# Patient Record
Sex: Female | Born: 1975 | Race: White | Hispanic: No | Marital: Married | Smoking: Former smoker
Health system: Southern US, Community
[De-identification: ages and names within clinical notes are randomized; demographics above are authoritative.]

## PROBLEM LIST (undated history)

## (undated) DIAGNOSIS — F419 Anxiety disorder, unspecified: Secondary | ICD-10-CM

## (undated) DIAGNOSIS — M542 Cervicalgia: Secondary | ICD-10-CM

## (undated) DIAGNOSIS — M25519 Pain in unspecified shoulder: Secondary | ICD-10-CM

## (undated) DIAGNOSIS — M549 Dorsalgia, unspecified: Secondary | ICD-10-CM

## (undated) DIAGNOSIS — I10 Essential (primary) hypertension: Secondary | ICD-10-CM

## (undated) DIAGNOSIS — J45909 Unspecified asthma, uncomplicated: Secondary | ICD-10-CM

## (undated) DIAGNOSIS — R000 Tachycardia, unspecified: Secondary | ICD-10-CM

## (undated) DIAGNOSIS — N289 Disorder of kidney and ureter, unspecified: Secondary | ICD-10-CM

## (undated) DIAGNOSIS — K219 Gastro-esophageal reflux disease without esophagitis: Secondary | ICD-10-CM

## (undated) HISTORY — DX: Dorsalgia, unspecified: M54.9

## (undated) HISTORY — DX: Anxiety disorder, unspecified: F41.9

## (undated) HISTORY — DX: Pain in unspecified shoulder: M25.519

## (undated) HISTORY — DX: Cervicalgia: M54.2

## (undated) HISTORY — DX: Gastro-esophageal reflux disease without esophagitis: K21.9

## (undated) HISTORY — DX: Unspecified asthma, uncomplicated: J45.909

## (undated) HISTORY — DX: Tachycardia, unspecified: R00.0

## (undated) HISTORY — DX: Essential (primary) hypertension: I10

---

## 2003-02-21 ENCOUNTER — Ambulatory Visit (HOSPITAL_COMMUNITY): Admission: RE | Admit: 2003-02-21 | Discharge: 2003-02-21 | Payer: Self-pay | Admitting: Obstetrics and Gynecology

## 2003-03-12 ENCOUNTER — Encounter: Admission: RE | Admit: 2003-03-12 | Discharge: 2003-03-12 | Payer: Self-pay | Admitting: Obstetrics and Gynecology

## 2003-03-19 ENCOUNTER — Encounter: Admission: RE | Admit: 2003-03-19 | Discharge: 2003-03-19 | Payer: Self-pay | Admitting: Obstetrics and Gynecology

## 2003-03-19 ENCOUNTER — Ambulatory Visit (HOSPITAL_COMMUNITY): Admission: RE | Admit: 2003-03-19 | Discharge: 2003-03-19 | Payer: Self-pay | Admitting: Obstetrics and Gynecology

## 2003-03-25 ENCOUNTER — Inpatient Hospital Stay (HOSPITAL_COMMUNITY): Admission: AD | Admit: 2003-03-25 | Discharge: 2003-03-25 | Payer: Self-pay | Admitting: Obstetrics and Gynecology

## 2003-03-27 ENCOUNTER — Inpatient Hospital Stay (HOSPITAL_COMMUNITY): Admission: AD | Admit: 2003-03-27 | Discharge: 2003-03-29 | Payer: Self-pay | Admitting: Obstetrics and Gynecology

## 2004-08-10 IMAGING — US US ABDOMEN COMPLETE
1 series · 18 of 25 positions shown · non-contrast
Comparison: none

[Series 1: us abdomen complete · 18 of 54 slices shown]
[im 1/54]
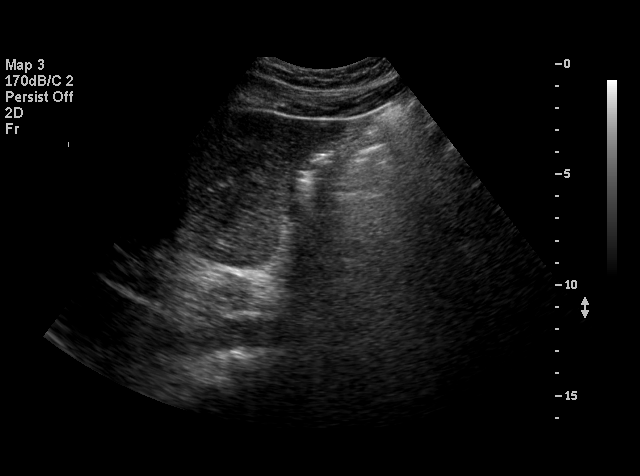
[im 5/54]
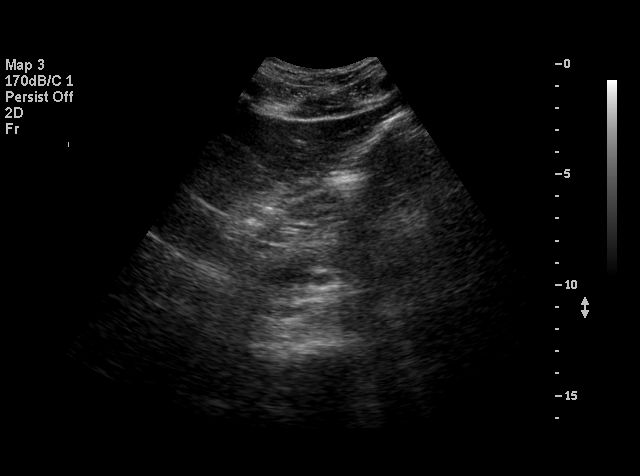
[im 7/54]
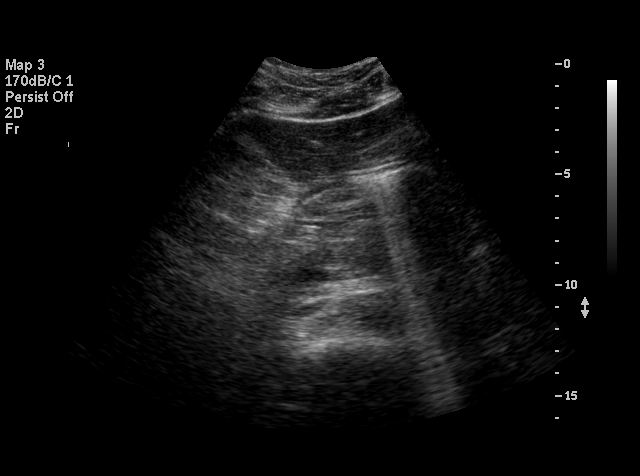
[im 9/54]
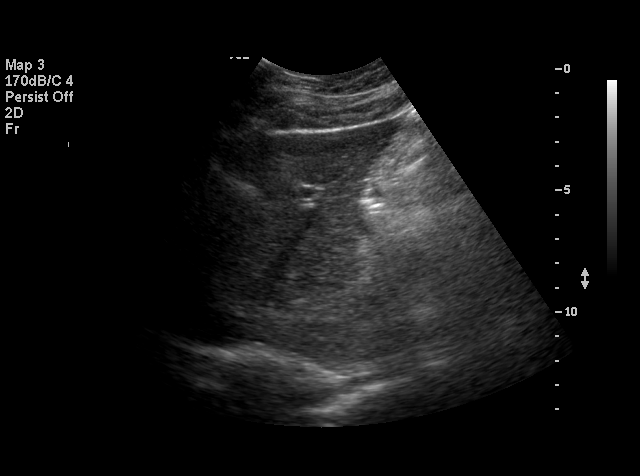
[im 14/54]
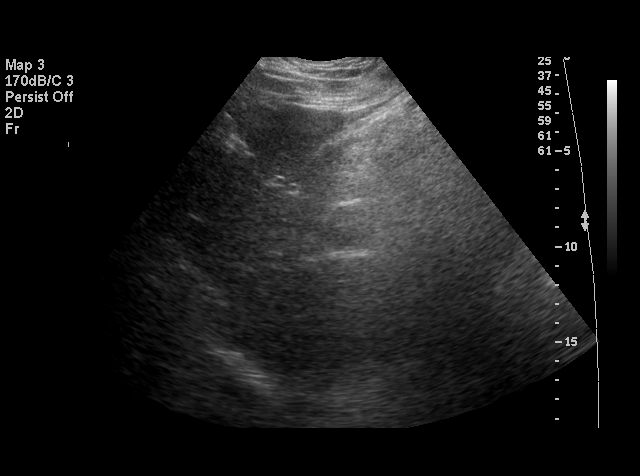
[im 16/54]
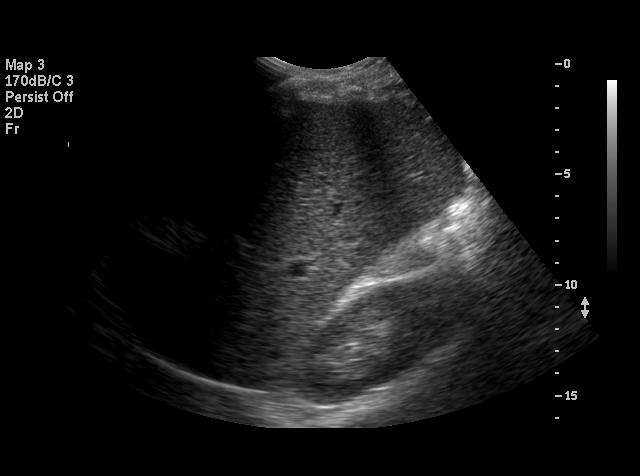
[im 20/54]
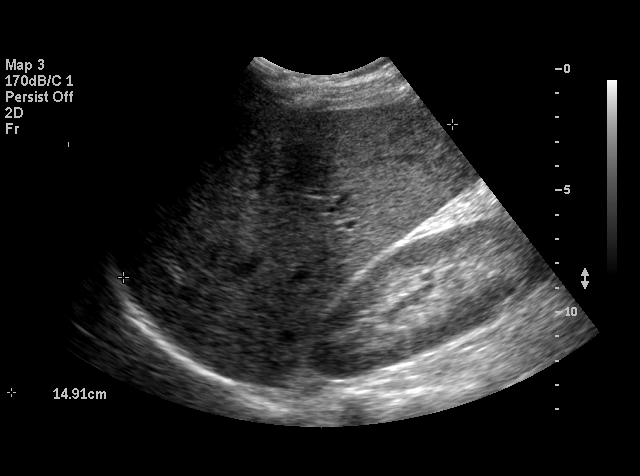
[im 23/54]
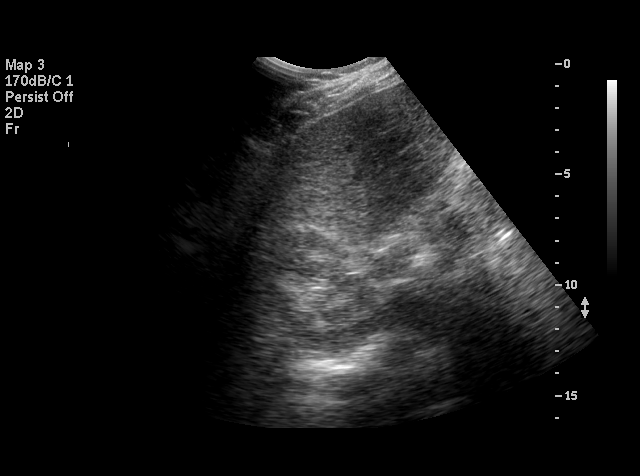
[im 25/54]
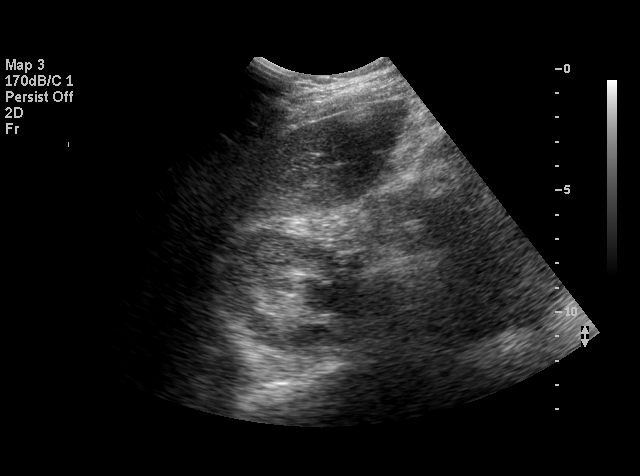
[im 29/54]
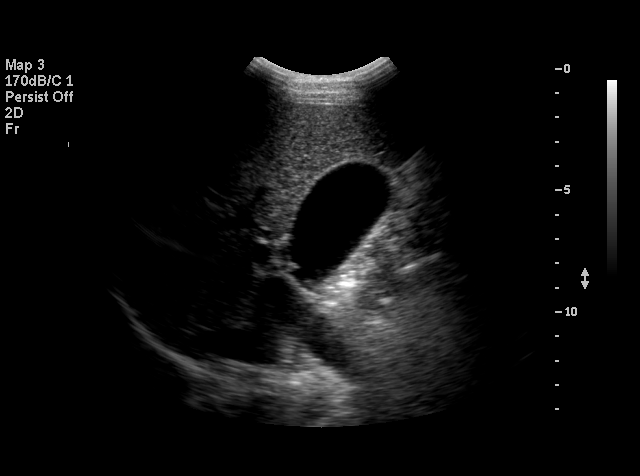
[im 31/54]
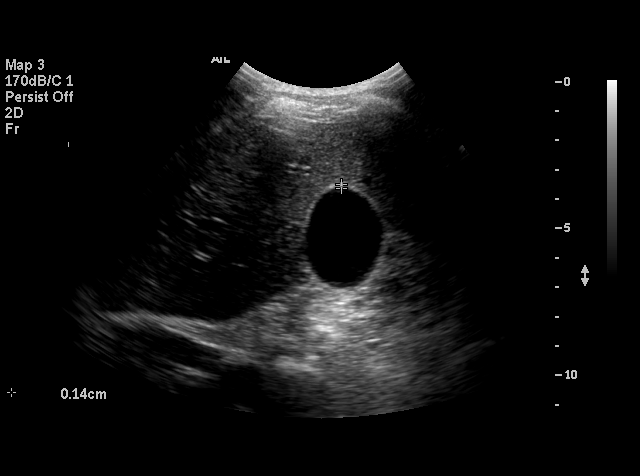
[im 34/54]
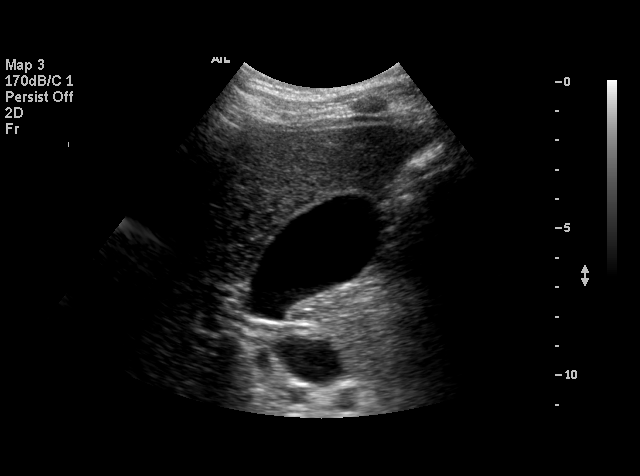
[im 38/54]
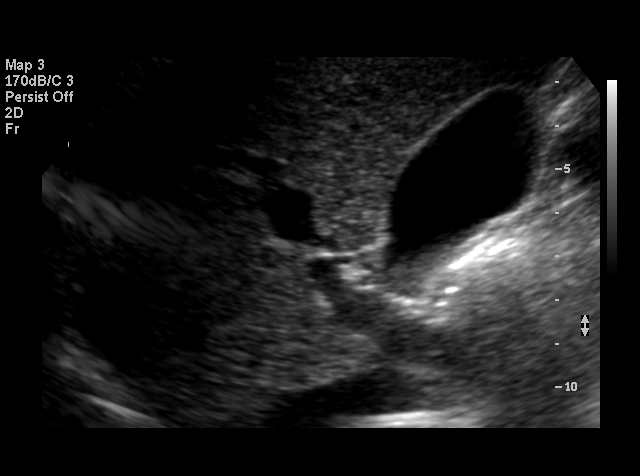
[im 40/54]
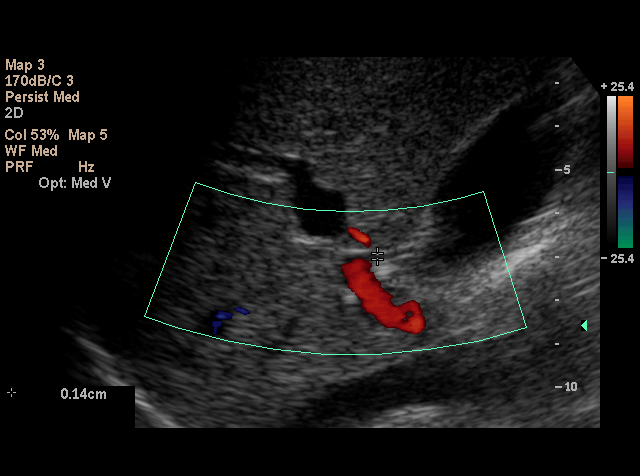
[im 45/54]
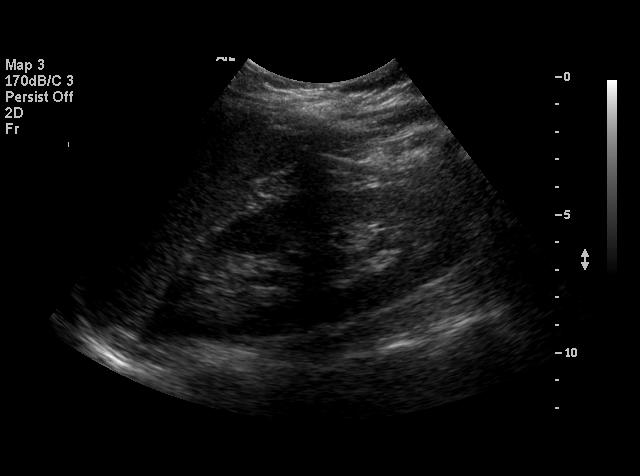
[im 47/54]
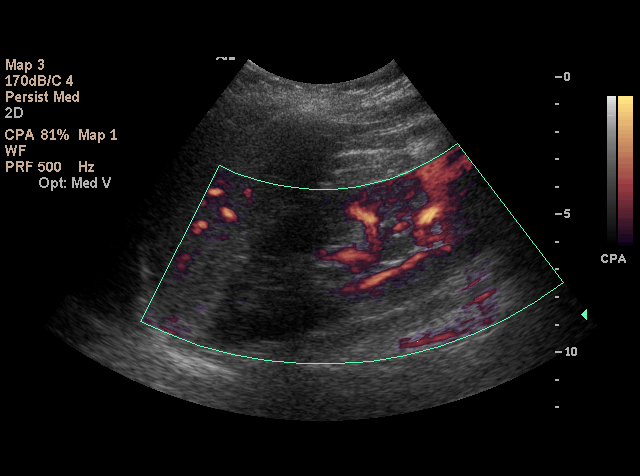
[im 49/54]
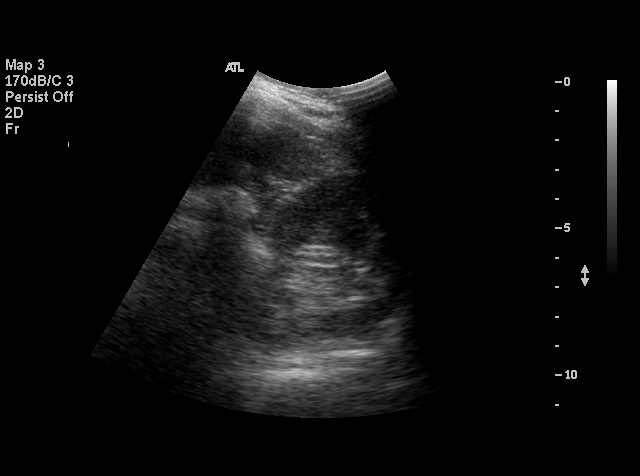
[im 54/54]
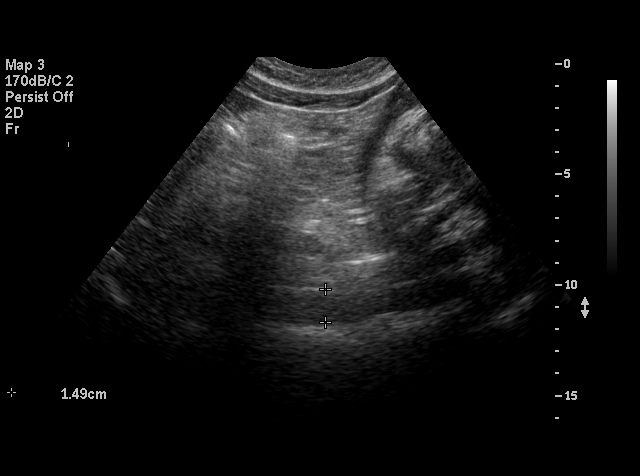

[18 of 25 positions shown; findings below may reference images not displayed]

<!--  IDXRADR:ADDEND:BEGIN -->Addendum Begins<!--  IDXRADR:ADDEND:INNER_BEGIN -->The proximal IVC is unremarkable.  

 <!--  IDXRADR:ADDEND:INNER_END -->Addendum Ends
<!--  IDXRADR:ADDEND:END -->Clinical data:   33 weeks estimated gestational age with right upper quadrant pain.  
 ABDOMINAL ULTRASOUND:
 Multiple images of the abdomen were obtained.  The subdiaphragmatic portion of liver was difficult to visualize due to shadowing and the patient?s inability to take a deep a breath.  The visualized portion of the hepatic parenchyma is within normal limits with no signs of focal parenchymal abnormality or intrahepatic ductal dilatation.  The gallbladder is well distended and shows no evidence for intraluminal stones or sludge.  No pericholecystic fluid or gallbladder wall thickening is seen.  The common bile duct is within normal limits for size with an AP measurement of .14 cm.  
 The pancreas could not be seen due to shadowing from overlying bowel.  The spleen has a normal ultrasound appearance as do both kidneys with the right kidney having a maximal sagittal length of 12.0 cm and the left kidney having a maximal sagittal length of 12.2 cm. No focal parenchymal abnormalities or hydronephrosis are noted.  No intraabdominal fluid is seen.  The proximal aorta is unremarkable.
 IMPRESSION
 Incomplete visualization of the subdiaphragmatic portion of the liver and pancreas.  Otherwise, unremarkable abdominal ultrasound.

## 2012-02-08 ENCOUNTER — Other Ambulatory Visit (HOSPITAL_COMMUNITY): Payer: Self-pay | Admitting: Internal Medicine

## 2012-02-08 DIAGNOSIS — R110 Nausea: Secondary | ICD-10-CM

## 2012-02-14 ENCOUNTER — Encounter (HOSPITAL_COMMUNITY): Payer: Self-pay

## 2012-02-17 ENCOUNTER — Encounter (HOSPITAL_COMMUNITY): Payer: Managed Care, Other (non HMO) | Attending: Internal Medicine

## 2012-04-10 ENCOUNTER — Ambulatory Visit: Payer: Managed Care, Other (non HMO) | Admitting: Family Medicine

## 2012-04-14 ENCOUNTER — Ambulatory Visit (INDEPENDENT_AMBULATORY_CARE_PROVIDER_SITE_OTHER): Payer: Managed Care, Other (non HMO) | Admitting: Family Medicine

## 2012-04-14 VITALS — BP 160/111 | Ht 62.0 in | Wt 166.0 lb

## 2012-04-14 DIAGNOSIS — M25511 Pain in right shoulder: Secondary | ICD-10-CM

## 2012-04-14 DIAGNOSIS — M25519 Pain in unspecified shoulder: Secondary | ICD-10-CM | POA: Insufficient documentation

## 2012-04-14 DIAGNOSIS — M542 Cervicalgia: Secondary | ICD-10-CM

## 2012-04-14 DIAGNOSIS — M545 Low back pain: Secondary | ICD-10-CM | POA: Insufficient documentation

## 2012-04-14 MED ORDER — NITROGLYCERIN 0.2 MG/HR TD PT24: MEDICATED_PATCH | TRANSDERMAL | Status: DC

## 2012-04-14 NOTE — Patient Instructions (Addendum)
Nitroglycerin Protocol   Apply 1/4 nitroglycerin patch to affected area daily.  Change position of patch within the affected area every 24 hours.  You may experience a headache during the first 1-2 weeks of using the patch, these should subside.  If you experience headaches after beginning nitroglycerin patch treatment, you may take your preferred over the counter pain reliever.  Another side effect of the nitroglycerin patch is skin irritation or rash related to patch adhesive.  Please notify our office if you develop more severe headaches or rash, and stop the patch.  Tendon healing with nitroglycerin patch may require 12 to 24 weeks depending on the extent of injury.  Men should not use if taking Viagra, Cialis, or Levitra.   Do not use if you have migraines or rosacea.   Please follow up in 4-5 weeks

## 2012-04-14 NOTE — Progress Notes (Signed)
Subjective:    Patient ID: Taylor Arellano, female    DOB: 11-24-1975, 37 y.o.   MRN: [id number]  HPI  Patient here for 3 issues. Upper one. Neck pain. Chronic and that it's been there for more than 5 years. A specific injury. Pain after she's at work all day. She works as a Leisure centre manager. She has some neck stiffness radiating down to both shoulders but primarily her right shoulder. Has had no dizziness. No history of neck trauma #2. Right shoulder pain. She is right-hand dominant. Does a lot of reaching forward in her job as a Leisure centre manager. This causes pain. She's been through physical therapy several years ago which helped some but has not given her long-standing relief even I. she's continue to do some of the exercises 2 or 3 times a week. She also has pain at night keeping her from sleeping on that side. #3. Bilateral hand numbness and some numbness and tingling in her feet. Most noticeable when she's at the end of a 12 hour shift. As noted no hand color changes, no weakness of the hands. Has noted no atrophy of the muscles #4. Low back pain. No specific injury. No radiation to legs. No incontinence of bowel or bladder. No leg weakness. Pain is in the low back will usually start as a 4/10 and by the end of her 12 hour shift she is at a 12 out of 12. Pain is aching in nature. Breasts usually helps at all she is also taking Percocet and tramadol intermittently.  Medication list is reviewed. No surgical history is pertinent to her muscle skeletal issues today. Review of Systems Denies fever, sweats, chills, unusual weight change. Also see history of present illness above for additional pertinent review of systems.    Objective:   Physical Exam Vital signs are reviewed GENERAL: Well-developed female no acute distress SKIN: No rash. Normal color and temperature. Several tattoos. SHOULDER: Right. Full range of motion in all planes the rotator cuff with intact strength. She has some pain on  supraspinatus testing and on the lift off test. The a.c. joint is mildly tender to palpation. Shoulders located. SCAPULAR: Symmetrical movement bilaterally. No winging. NECK: Full range of motion in flexion extension. Slight resisted for lateral rotation secondary to some neck spasm. The neck muscles reveal no obvious trigger points. Shoulder shrug bilaterally symmetrical. BACK: Normal without any sign defect or ecchymoses. Vertebra nontender to percussion. She can flex at the hips about 90. Limited by hamstring tightness. Straight leg raise is negative in seated position. ULTRASOUND: Shoulder. Right. Rotator cuff musculature appears intact. There are some calcifications within the supraspinatus. There is no significant fluid. The biceps tendon is normal in appearance and is located.  INJECTION: Patient was given informed consent, signed copy in the chart. Appropriate time out was taken. Area prepped and draped in usual sterile fashion. One cc of methylprednisolone 40 mg/ml plus  4 cc of 1% lidocaine without epinephrine was injected into the right subacromial bursa using a(n) posterior approach. The patient tolerated the procedure well. There were no complications. Post procedure instructions were given.        Assessment & Plan:  #1. Shoulder pain. Subacromial bursitis. I don't see anything surgical here. I would recommend restarting rehabilitation program. We did do a home exercise program and I have given her handout and Theragran. We also discussed and agreed to try the nitroglycerin patch I gave her instructions on that and call in prescription. We also gave her a corticosteroid  injection into the subacromial bursa today and see her back in 4 weeks. #2. Musculoskeletal neck pain. #3. Functional low back pain.

## 2012-05-12 ENCOUNTER — Ambulatory Visit: Payer: Managed Care, Other (non HMO) | Admitting: Family Medicine

## 2013-01-24 ENCOUNTER — Encounter: Payer: Self-pay | Admitting: *Deleted

## 2013-01-26 ENCOUNTER — Ambulatory Visit (INDEPENDENT_AMBULATORY_CARE_PROVIDER_SITE_OTHER): Payer: BC Managed Care – PPO | Admitting: Internal Medicine

## 2013-01-26 ENCOUNTER — Encounter: Payer: Self-pay | Admitting: Internal Medicine

## 2013-01-26 VITALS — BP 146/96 | HR 77 | Ht 62.0 in | Wt 159.0 lb

## 2013-01-26 DIAGNOSIS — R0602 Shortness of breath: Secondary | ICD-10-CM

## 2013-01-26 DIAGNOSIS — R002 Palpitations: Secondary | ICD-10-CM

## 2013-01-26 LAB — CBC
HCT: 39 % (ref 36.0–46.0)
Hemoglobin: 13.1 g/dL (ref 12.0–15.0)
MCH: 29.2 pg (ref 26.0–34.0)
MCHC: 33.6 g/dL (ref 30.0–36.0)
MCV: 87.1 fL (ref 78.0–100.0)
PLATELETS: 361 10*3/uL (ref 150–400)
RBC: 4.48 MIL/uL (ref 3.87–5.11)
RDW: 13.5 % (ref 11.5–15.5)
WBC: 9.4 10*3/uL (ref 4.0–10.5)

## 2013-01-26 MED ORDER — METOPROLOL TARTRATE 25 MG PO TABS: 25.0000 mg | ORAL_TABLET | Freq: Two times a day (BID) | ORAL | Status: AC

## 2013-01-26 MED ORDER — AMLODIPINE BESYLATE 5 MG PO TABS
5.0000 mg | ORAL_TABLET | Freq: Every day | ORAL | Status: AC
Start: 2013-01-26 — End: ?

## 2013-01-26 NOTE — Patient Instructions (Addendum)
Please stop your Diltiazem, Start Amlodipine 5 mg a day, Start Lopressor 25 mg one twice a day, Continue all other medications as listed.  Please have blod work today Media planner(CBC,TSH)  Your physician has requested that you have an echocardiogram. Echocardiography is a painless test that uses sound waves to create images of your heart. It provides your doctor with information about the size and shape of your heart and how well your heart's chambers and valves are working. This procedure takes approximately one hour. There are no restrictions for this procedure.  Your physician has recommended that you wear an event monitor. Event monitors are medical devices that record the heart's electrical activity. Doctors most often us these monitors to diagnose arrhythmias. Arrhythmias are problems with the speed or rhythm of the heartbeat. The monitor is a small, portable device. You can wear one while you do your normal daily activities. This is usually used to diagnose what is causing palpitations/syncope (passing out).  Follow up with Dr Tenny Crawoss in 4 to 6 weeks.

## 2013-01-26 NOTE — Progress Notes (Signed)
HPI Patient is a 38 yo who was referred for evaluation of HTN and SOB The patient is followed by Dr Sudie Bailey She hs a history of HTN  Been on meds for 2 years (last bystolic and Maxzide which were d/c'd)  Says her BP is never right  Up and down OVer the past 2 wks she has had episodes of feeling her heart race.  Like running a marathon.  Worse later in day  Dizzy at times if turns quickly or changing position  No syncope.   Has SOB without ever doing much, esp when heart racing.   BPover past wk 149-170/98-106 No Known Allergies  Current Outpatient Prescriptions  Medication Sig Dispense Refill  . albuterol (PROVENTIL HFA;VENTOLIN HFA) 108 (90 BASE) MCG/ACT inhaler Inhale 2 puffs into the lungs every 6 (six) hours as needed for wheezing or shortness of breath.      . ALPRAZolam (XANAX) 0.25 MG tablet Take 0.25 mg by mouth 3 (three) times daily as needed for anxiety.      . beclomethasone (QVAR) 80 MCG/ACT inhaler Inhale 1 puff into the lungs 2 (two) times daily.      . Cyanocobalamin (VITAMIN B 12 PO) Take by mouth daily.      Marland Kitchen diltiazem (CARDIZEM CD) 120 MG 24 hr capsule Take 1 capsule by mouth 2 (two) times daily.       Marland Kitchen FLUoxetine (PROZAC) 20 MG capsule Take 20 mg by mouth daily.      . hydrochlorothiazide (MICROZIDE) 12.5 MG capsule Take 12.5 mg by mouth daily as needed.      Marland Kitchen oxyCODONE-acetaminophen (PERCOCET) 10-325 MG per tablet Take 1 tablet by mouth every 4 (four) hours as needed for pain.      . ranitidine (ZANTAC) 150 MG capsule Take 150 mg by mouth daily as needed.       . traMADol (ULTRAM) 50 MG tablet Take by mouth every 6 (six) hours as needed.      . vitamin E 400 UNIT capsule Take 400 Units by mouth daily.       No current facility-administered medications for this visit.    Past Medical History  Diagnosis Date  . Hypertension   . Asthma   . Anxiety   . Esophageal reflux   . Backache, unspecified   . Cervicalgia   . Pain in joint, shoulder region   . Tachycardia,  unspecified     No past surgical history on file.  Family History  Problem Relation Age of Onset  . Hyperlipidemia Mother   . Hypertension Father   . Heart disease Paternal Grandmother     History   Social History  . Marital Status: Married    Spouse Name: N/A    Number of Children: N/A  . Years of Education: N/A   Occupational History  . bartender Other   Social History Main Topics  . Smoking status: Former Games developer  . Smokeless tobacco: Not on file  . Alcohol Use: Not on file  . Drug Use: No  . Sexual Activity: Not on file   Other Topics Concern  . Not on file   Social History Narrative  . No narrative on file    Review of Systems:  All systems reviewed.  They are negative to the above problem except as previously stated.  Vital Signs: BP 146/96  Pulse 77  Ht 5\' 2"  (1.575 m)  Wt 159 lb (72.122 kg)  BMI 29.07 kg/m2  Physical Exam Patient is in NAD  HEENT:  Normocephalic, atraumatic. EOMI, PERRLA.  Neck: JVP is normal.  No bruits.  Lungs: clear to auscultation. No rales no wheezes.  Heart: Regular rate and rhythm. Normal S1, S2. No S3.   No significant murmurs. PMI not displaced.  Abdomen:  Supple, nontender. Normal bowel sounds. No masses. No hepatomegaly.  Extremities:   Good distal pulses throughout. No lower extremity edema.  Musculoskeletal :moving all extremities.  Neuro:   alert and oriented x3.  CN II-XII grossly intact.  EKG SR 77  Nonspecific ST T wave changes Assessment and Plan:   1.  Palpitations  I am not convinced of a arrhythmia  May represent ST  COncerning with dizzines  Will set up for event monitor  Check TSH and CBC  2.  Dsypnea.  Labs  Get echo  May be related to HTN  3.  HTN  Poorly controlled  Would stop dilt (she is almost out)  Add amlodipine 5 and lopressor 25 bid  May need to add back HCTZ regularly.  F/U in 4 wks.

## 2013-01-27 LAB — TSH: TSH: 0.565 u[IU]/mL (ref 0.350–4.500)

## 2013-01-29 ENCOUNTER — Encounter: Payer: Self-pay | Admitting: *Deleted

## 2013-01-29 ENCOUNTER — Encounter (INDEPENDENT_AMBULATORY_CARE_PROVIDER_SITE_OTHER): Payer: BC Managed Care – PPO

## 2013-01-29 DIAGNOSIS — R0602 Shortness of breath: Secondary | ICD-10-CM

## 2013-01-29 DIAGNOSIS — R002 Palpitations: Secondary | ICD-10-CM

## 2013-01-29 NOTE — Progress Notes (Signed)
Patient ID: Taylor Arellano, female   DOB: 1975/11/13, [age] y.o.   MRN: [id number] E-Cardio verite 30 day cardiac event monitor applied to patient.

## 2013-02-14 ENCOUNTER — Other Ambulatory Visit (HOSPITAL_COMMUNITY): Payer: BC Managed Care – PPO

## 2013-03-05 ENCOUNTER — Ambulatory Visit: Payer: BC Managed Care – PPO | Admitting: Internal Medicine

## 2013-03-07 ENCOUNTER — Telehealth: Payer: Self-pay | Admitting: *Deleted

## 2013-03-07 NOTE — Telephone Encounter (Signed)
Message per dr Tenny Crawross     Event monitor turned in SHowed no arrhythmia. Symptoms did not always correlated with faster heart rates.    She should have appt to come back for BP eval    Stay hydrated.    Spoke with pt, Follow up scheduled

## 2013-03-13 ENCOUNTER — Encounter: Payer: Self-pay | Admitting: Internal Medicine

## 2013-04-02 ENCOUNTER — Ambulatory Visit: Payer: BC Managed Care – PPO | Admitting: Internal Medicine

## 2013-04-06 ENCOUNTER — Encounter: Payer: Self-pay | Admitting: Internal Medicine

## 2014-04-01 ENCOUNTER — Ambulatory Visit: Payer: Self-pay

## 2014-05-23 ENCOUNTER — Other Ambulatory Visit: Payer: Self-pay | Admitting: Orthopedic Surgery

## 2014-08-05 ENCOUNTER — Ambulatory Visit (HOSPITAL_BASED_OUTPATIENT_CLINIC_OR_DEPARTMENT_OTHER): Admission: RE | Admit: 2014-08-05 | Payer: 59 | Source: Ambulatory Visit | Admitting: Orthopedic Surgery

## 2014-08-05 ENCOUNTER — Encounter (HOSPITAL_BASED_OUTPATIENT_CLINIC_OR_DEPARTMENT_OTHER): Admission: RE | Payer: Self-pay | Source: Ambulatory Visit

## 2014-08-05 SURGERY — SHOULDER ARTHROSCOPY WITH SUBACROMIAL DECOMPRESSION
Anesthesia: Choice | Laterality: Right

## 2016-02-20 ENCOUNTER — Emergency Department (INDEPENDENT_AMBULATORY_CARE_PROVIDER_SITE_OTHER)
Admission: EM | Admit: 2016-02-20 | Discharge: 2016-02-20 | Disposition: A | Discharge status: Home / Self Care | Payer: Medicaid Other | Source: Home / Self Care | Attending: Family Medicine | Admitting: Family Medicine

## 2016-02-20 ENCOUNTER — Encounter: Payer: Self-pay | Admitting: Emergency Medicine

## 2016-02-20 DIAGNOSIS — M545 Low back pain, unspecified: Secondary | ICD-10-CM

## 2016-02-20 DIAGNOSIS — T148XXA Other injury of unspecified body region, initial encounter: Secondary | ICD-10-CM

## 2016-02-20 DIAGNOSIS — G8929 Other chronic pain: Secondary | ICD-10-CM | POA: Diagnosis not present

## 2016-02-20 HISTORY — DX: Disorder of kidney and ureter, unspecified: N28.9

## 2016-02-20 MED ORDER — METHOCARBAMOL 500 MG PO TABS: 500.0000 mg | ORAL_TABLET | Freq: Two times a day (BID) | ORAL | 0 refills | Status: AC

## 2016-02-20 NOTE — ED Provider Notes (Signed)
CSN: [id number]     Arrival date & time 02/20/16  1959 History   First MD Initiated Contact with Patient 02/20/16 2018     Chief Complaint  Patient presents with  . Back Pain   (Consider location/radiation/quality/duration/timing/severity/associated sxs/prior Treatment) HPI  Taylor Arellano is a 41 y.o. female presenting to UC with c/o exacerbation of chronic back pain that started last week.  Pain is aching and sore, worse with certain movements and positions, 9/10 at worst. She was seen and treated by her PCP last week for bronchitis and a UTI.  She was given bactrim last week.  Yesterday, she saw her PCP again and was noted to have wheezing so her PCP gave her an injection of steroids.  This did help with her breathing but not her back pain.  She was also found to have a mild UTI, the urine culture is still pending but she is currently taking Cipro since yesterday.   She was told she did have microscopic blood in her urine but no crystals.  She does have a hx of kidney stones but states this pain is the same on both sides of her back.  Denies fever, chills, n/v/d. Denies bowel or bladder incontinence.  She does have mild intermittent numbness in her legs but that is chronic, she takes gabapentin for that pain.  She did take ibuprofen earlier today with mild relief. Pt notes she is on her feet all day for work and did go to work today. She also drives about 1 hour to work as she lives in the [place] area but works in [place].  She wonders if work is causing her back pain to worsen but does not recall any specific injury.  She notes she was in pain management but did not like being on pain pills all the time.  She has seen a chiropractor in the past but none recently. She does not currently have an orthopedist.     Past Medical History:  Diagnosis Date  . Anxiety   . Asthma   . Backache, unspecified   . Cervicalgia   . Esophageal reflux   . Hypertension   . Pain in joint, shoulder region    . Renal disorder    calculi  . Tachycardia, unspecified    History reviewed. No pertinent surgical history. Family History  Problem Relation Age of Onset  . Hyperlipidemia Mother   . Hypertension Father   . Heart disease Paternal Grandmother    Social History  Substance Use Topics  . Smoking status: Former Games developer  . Smokeless tobacco: Current User  . Alcohol use No   OB History    No data available     Review of Systems  Genitourinary: Positive for dysuria. Negative for flank pain, frequency, hematuria and urgency.  Musculoskeletal: Positive for back pain and myalgias. Negative for arthralgias, neck pain and neck stiffness.  Skin: Negative for rash.  Neurological: Positive for numbness. Negative for weakness.    Allergies  Patient has no known allergies.  Home Medications   Prior to Admission medications   Medication Sig Start Date End Date Taking? Authorizing Provider  albuterol (PROVENTIL HFA;VENTOLIN HFA) 108 (90 BASE) MCG/ACT inhaler Inhale 2 puffs into the lungs every 6 (six) hours as needed for wheezing or shortness of breath.    Historical Provider, MD  ALPRAZolam Prudy Feeler) 0.25 MG tablet Take 0.25 mg by mouth 3 (three) times daily as needed for anxiety.    Historical Provider, MD  amLODipine (  NORVASC) 5 MG tablet Take 1 tablet (5 mg total) by mouth daily. 01/26/13   Pricilla Riffle, MD  beclomethasone (QVAR) 80 MCG/ACT inhaler Inhale 1 puff into the lungs 2 (two) times daily.    Historical Provider, MD  Cyanocobalamin (VITAMIN B 12 PO) Take by mouth daily.    Historical Provider, MD  FLUoxetine (PROZAC) 20 MG capsule Take 20 mg by mouth daily.    Historical Provider, MD  hydrochlorothiazide (MICROZIDE) 12.5 MG capsule Take 12.5 mg by mouth daily as needed.    Historical Provider, MD  methocarbamol (ROBAXIN) 500 MG tablet Take 1 tablet (500 mg total) by mouth 2 (two) times daily. 02/20/16   Junius Finner, PA-C  metoprolol tartrate (LOPRESSOR) 25 MG tablet Take 1 tablet  (25 mg total) by mouth 2 (two) times daily. 01/26/13   Pricilla Riffle, MD  oxyCODONE-acetaminophen (PERCOCET) 10-325 MG per tablet Take 1 tablet by mouth every 4 (four) hours as needed for pain.    Historical Provider, MD  ranitidine (ZANTAC) 150 MG capsule Take 150 mg by mouth daily as needed.     Historical Provider, MD  traMADol (ULTRAM) 50 MG tablet Take by mouth every 6 (six) hours as needed.    Historical Provider, MD  vitamin E 400 UNIT capsule Take 400 Units by mouth daily.    Historical Provider, MD   Meds Ordered and Administered this Visit  Medications - No data to display  BP 135/88 (BP Location: Left Arm)   Pulse 108   Temp 98.2 F (36.8 C) (Oral)   Resp 16   Ht 5\' 6"  (1.676 m)   Wt 168 lb (76.2 kg)   LMP 02/02/2016 (Exact Date)   SpO2 100%   BMI 27.12 kg/m  No data found.   Physical Exam  Constitutional: She is oriented to person, place, and time. She appears well-developed and well-nourished. No distress.  HENT:  Head: Normocephalic and atraumatic.  Mouth/Throat: Oropharynx is clear and moist.  Eyes: EOM are normal.  Neck: Normal range of motion. Neck supple.  No midline bone tenderness, no crepitus or step-offs.   Cardiovascular: Normal rate and regular rhythm.   Pulmonary/Chest: Effort normal and breath sounds normal. No respiratory distress. She has no wheezes. She has no rales.  Abdominal: Soft. She exhibits no distension and no mass. There is no tenderness. There is no rebound and no guarding.  Musculoskeletal: Normal range of motion. She exhibits tenderness. She exhibits no edema.  No midline spinal tenderness. Tenderness to mid thoracic and lumbar back muscles.  Full ROM upper and lower extremities with 5/5 strength. Normal gait.   Neurological: She is alert and oriented to person, place, and time.  Skin: Skin is warm and dry. Capillary refill takes less than 2 seconds. No rash noted. She is not diaphoretic. No erythema.  Psychiatric: She has a normal mood and  affect. Her behavior is normal.  Nursing note and vitals reviewed.   Urgent Care Course     Procedures (including critical care time)  Labs Review Labs Reviewed - No data to display  Imaging Review No results found.   MDM   1. Acute exacerbation of chronic low back pain   2. Muscle strain    Pt c/o exacerbation of chronic back pain. Currently taking Cipro for UTI dx yesterday by PCP. Pt also reports recent bronchitis.  No known injury. Question if recent coughing from bronchitis and/or pt's work/commute has exacerbated back pain at this time. No red flag symptoms.  She reports intermittent leg numbness but notes it is chronic, takes gabapentin for this.  No bony tenderness. Most likely muscle strain. No indication for imaging at this time. Rx: Robaxin Encouraged rest, alternating cool and warm compresses. F/u with PCP and Sports Medicine (contact info provided) next week for recheck of back pain.    Junius FinnerErin O'Malley, PA-C 02/21/16 [phone]

## 2016-02-20 NOTE — ED Triage Notes (Signed)
Reports back pain from kidneys on down; on Cipro since yesterday for UTI; was treated with steroids last week for bronchitis; was told microscopic u/a yesterday did not have crystals ( she has history of kidney stones ). Took ibuprofen earlier in day.
